# Patient Record
Sex: Male | Born: 1970 | Race: White | Hispanic: No | Marital: Single | Smoking: Former smoker
Health system: Southern US, Community
[De-identification: ages and names within clinical notes are randomized; demographics above are authoritative.]

## PROBLEM LIST (undated history)

## (undated) DIAGNOSIS — I10 Essential (primary) hypertension: Secondary | ICD-10-CM

## (undated) DIAGNOSIS — E119 Type 2 diabetes mellitus without complications: Secondary | ICD-10-CM

---

## 2006-11-22 ENCOUNTER — Ambulatory Visit: Payer: Self-pay | Admitting: Internal Medicine

## 2009-01-06 ENCOUNTER — Emergency Department: Payer: Self-pay | Admitting: Emergency Medicine

## 2010-11-25 ENCOUNTER — Emergency Department: Payer: Self-pay | Admitting: Emergency Medicine

## 2015-08-03 ENCOUNTER — Emergency Department
Admission: EM | Admit: 2015-08-03 | Discharge: 2015-08-03 | Disposition: A | Discharge status: Home / Self Care | Payer: Self-pay | Attending: Emergency Medicine | Admitting: Emergency Medicine

## 2015-08-03 DIAGNOSIS — L989 Disorder of the skin and subcutaneous tissue, unspecified: Secondary | ICD-10-CM | POA: Insufficient documentation

## 2015-08-03 DIAGNOSIS — R21 Rash and other nonspecific skin eruption: Secondary | ICD-10-CM | POA: Insufficient documentation

## 2015-08-03 DIAGNOSIS — Z880 Allergy status to penicillin: Secondary | ICD-10-CM | POA: Insufficient documentation

## 2015-08-03 DIAGNOSIS — Z79899 Other long term (current) drug therapy: Secondary | ICD-10-CM | POA: Insufficient documentation

## 2015-08-03 DIAGNOSIS — Z7982 Long term (current) use of aspirin: Secondary | ICD-10-CM | POA: Insufficient documentation

## 2015-08-03 MED ORDER — MUPIROCIN 2 % EX OINT: TOPICAL_OINTMENT | CUTANEOUS | Status: AC

## 2015-08-03 NOTE — ED Notes (Signed)
States he developed a small area to top of nose about 3-4 days ago.  Area has become larger

## 2015-08-03 NOTE — ED Provider Notes (Signed)
[facility] Emergency Department Provider Note  ____________________________________________  Time seen: Approximately 7:26 AM  I have reviewed the triage vital signs and the nursing notes.   HISTORY  Chief Complaint Skin Ulcer   HPI George Hines is a 44 y.o. male is here with complaint of small area on the top of his nose is been there for approximately one week. He states the area has become larger. In the past he is used peroxide and Neosporin with healing however this time there is no improvement. He denies any fever or chills. We discussed his multiple sunburns and sun exposure going up and even now. Patient states that the area is not painful. He did squeeze on it last night without any drainage. Currently his pain level is 1/10.   No past medical history on file.  There are no active problems to display for this patient.   No past surgical history on file.  Current Outpatient Rx  Name  Route  Sig  Dispense  Refill  . allopurinol (ZYLOPRIM) 100 MG tablet   Oral   Take 100 mg by mouth daily.         Marland Kitchen amLODipine (NORVASC) 10 MG tablet   Oral   Take 10 mg by mouth daily.         Marland Kitchen aspirin 81 MG tablet   Oral   Take 81 mg by mouth daily.         Marland Kitchen atorvastatin (LIPITOR) 40 MG tablet   Oral   Take 40 mg by mouth daily.         . cloNIDine (CATAPRES) 0.1 MG tablet   Oral   Take 0.1 mg by mouth 2 (two) times daily.         Marland Kitchen lisinopril (PRINIVIL,ZESTRIL) 40 MG tablet   Oral   Take 40 mg by mouth daily.         . metFORMIN (GLUCOPHAGE) 500 MG tablet   Oral   Take 500 mg by mouth 2 (two) times daily with a meal.         . metoprolol (LOPRESSOR) 100 MG tablet   Oral   Take 100 mg by mouth 2 (two) times daily.         . mupirocin ointment (BACTROBAN) 2 %      Apply to affected area 3 times daily   22 g   0     Allergies Penicillins  No family history on file.  Social History Social History  Substance Use  Topics  . Smoking status: Not on file  . Smokeless tobacco: Not on file  . Alcohol Use: Not on file    Review of Systems Constitutional: No fever/chills Cardiovascular: Denies chest pain. Respiratory: Denies shortness of breath. Gastrointestinal: No nausea, no vomiting. Skin: Positive for lesion on the nose. No history of MRSA Neurological: Negative for headaches, focal weakness or numbness.  10-point ROS otherwise negative.  ____________________________________________   PHYSICAL EXAM:  VITAL SIGNS: ED Triage Vitals  Enc Vitals Group     BP 08/03/15 0717 169/89 mmHg     Pulse Rate 08/03/15 0717 112     Resp 08/03/15 0717 22     Temp 08/03/15 0717 97.5 F (36.4 C)     Temp Source 08/03/15 0717 Oral     SpO2 08/03/15 0717 100 %     Weight 08/03/15 0717 225 lb (102.059 kg)     Height 08/03/15 0717  (1.676 m)     Head  Cir --      Peak Flow --      Pain Score 08/03/15 0720 1     Pain Loc --      Pain Edu? --      Excl. in GC? --     Constitutional: Alert and oriented. Well appearing and in no acute distress. Eyes: Conjunctivae are normal. PERRL. EOMI. Head: Atraumatic. Nose: No congestion/rhinnorhea. Neck: No stridor.   Hematological/Lymphatic/Immunilogical: No cervical lymphadenopathy. Cardiovascular: Normal rate, regular rhythm. Grossly normal heart sounds.  Good peripheral circulation. Respiratory: Normal respiratory effort.  No retractions. Lungs CTAB. Gastrointestinal: Soft and nontender. No distention.  Musculoskeletal: No lower extremity tenderness nor edema.  No joint effusions. Neurologic:  Normal speech and language. No gross focal neurologic deficits are appreciated. No gait instability. Skin:  Skin is warm, dry and intact. There is a 1 cm shallow ulcerative lesion at the bridge of the patient's nose. Minimal erythema and no drainage noted. There is nontender to touch. There are raised borders that appears moved around this area. Psychiatric: Mood and  affect are normal. Speech and behavior are normal.  ____________________________________________   LABS (all labs ordered are listed, but only abnormal results are displayed)  Labs Reviewed - No data to display  PROCEDURES  Procedure(s) performed: None  Critical Care performed: No  ____________________________________________   INITIAL IMPRESSION / ASSESSMENT AND PLAN / ED COURSE  Pertinent labs & imaging results that were available during my care of the patient were reviewed by me and considered in my medical decision making (see chart for details).  Patient is to follow-up with [facility]. We discussed possibility of skin cancer. His primary care doctor is in [place] and he is unable to get an appointment with [facility] if he is to call his primary care doctor. He was given a prescription for Bactroban and told to discontinue using peroxide. ____________________________________________   FINAL CLINICAL IMPRESSION(S) / ED DIAGNOSES  Final diagnoses:  Skin eruption  Non-healing skin lesion of nose      Tommi Rumps, PA-C 08/03/15 0759  Tommi Rumps, PA-C 08/03/15 0954  Tommi Rumps, PA-C 08/03/15 1015  Darien Ramus, MD 08/03/15 1524

## 2015-08-03 NOTE — ED Notes (Signed)
Infection to the top of nose for about one week. States he has been treating it with peroxide and neosporin with no success.

## 2017-01-29 ENCOUNTER — Emergency Department
Admission: EM | Admit: 2017-01-29 | Discharge: 2017-01-29 | Disposition: A | Discharge status: Home / Self Care | Payer: No Typology Code available for payment source | Attending: Emergency Medicine | Admitting: Emergency Medicine

## 2017-01-29 ENCOUNTER — Emergency Department: Payer: No Typology Code available for payment source

## 2017-01-29 ENCOUNTER — Encounter: Payer: Self-pay | Admitting: Emergency Medicine

## 2017-01-29 DIAGNOSIS — S50811A Abrasion of right forearm, initial encounter: Secondary | ICD-10-CM | POA: Diagnosis not present

## 2017-01-29 DIAGNOSIS — Z7984 Long term (current) use of oral hypoglycemic drugs: Secondary | ICD-10-CM | POA: Diagnosis not present

## 2017-01-29 DIAGNOSIS — Y92410 Unspecified street and highway as the place of occurrence of the external cause: Secondary | ICD-10-CM | POA: Insufficient documentation

## 2017-01-29 DIAGNOSIS — Z7982 Long term (current) use of aspirin: Secondary | ICD-10-CM | POA: Insufficient documentation

## 2017-01-29 DIAGNOSIS — Y9389 Activity, other specified: Secondary | ICD-10-CM | POA: Insufficient documentation

## 2017-01-29 DIAGNOSIS — I10 Essential (primary) hypertension: Secondary | ICD-10-CM | POA: Insufficient documentation

## 2017-01-29 DIAGNOSIS — S50812A Abrasion of left forearm, initial encounter: Secondary | ICD-10-CM | POA: Diagnosis not present

## 2017-01-29 DIAGNOSIS — Y999 Unspecified external cause status: Secondary | ICD-10-CM | POA: Insufficient documentation

## 2017-01-29 DIAGNOSIS — S30811A Abrasion of abdominal wall, initial encounter: Secondary | ICD-10-CM | POA: Insufficient documentation

## 2017-01-29 DIAGNOSIS — T07XXXA Unspecified multiple injuries, initial encounter: Secondary | ICD-10-CM

## 2017-01-29 DIAGNOSIS — Z87891 Personal history of nicotine dependence: Secondary | ICD-10-CM | POA: Insufficient documentation

## 2017-01-29 DIAGNOSIS — S1091XA Abrasion of unspecified part of neck, initial encounter: Secondary | ICD-10-CM | POA: Insufficient documentation

## 2017-01-29 DIAGNOSIS — S199XXA Unspecified injury of neck, initial encounter: Secondary | ICD-10-CM | POA: Diagnosis present

## 2017-01-29 DIAGNOSIS — M542 Cervicalgia: Secondary | ICD-10-CM

## 2017-01-29 DIAGNOSIS — E119 Type 2 diabetes mellitus without complications: Secondary | ICD-10-CM | POA: Diagnosis not present

## 2017-01-29 HISTORY — DX: Type 2 diabetes mellitus without complications: E11.9

## 2017-01-29 HISTORY — DX: Essential (primary) hypertension: I10

## 2017-01-29 MED ORDER — ORPHENADRINE CITRATE 30 MG/ML IJ SOLN
60.0000 mg | INTRAMUSCULAR | Status: AC
  Administered 2017-01-29: 60 mg via INTRAMUSCULAR

## 2017-01-29 MED ORDER — ORPHENADRINE CITRATE 30 MG/ML IJ SOLN
INTRAMUSCULAR | Status: AC
  Filled 2017-01-29: qty 2

## 2017-01-29 MED ORDER — CYCLOBENZAPRINE HCL 5 MG PO TABS: 5.0000 mg | ORAL_TABLET | Freq: Three times a day (TID) | ORAL | 0 refills | Status: AC | PRN

## 2017-01-29 MED ORDER — KETOROLAC TROMETHAMINE 60 MG/2ML IM SOLN
INTRAMUSCULAR | Status: AC
  Filled 2017-01-29: qty 2

## 2017-01-29 MED ORDER — KETOROLAC TROMETHAMINE 60 MG/2ML IM SOLN
60.0000 mg | Freq: Once | INTRAMUSCULAR | Status: AC
  Administered 2017-01-29: 60 mg via INTRAMUSCULAR
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
## 2017-01-29 NOTE — ED Notes (Signed)
Patient transported to CT 
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
## 2018-10-30 ENCOUNTER — Encounter: Payer: Self-pay | Admitting: Emergency Medicine

## 2018-10-30 ENCOUNTER — Other Ambulatory Visit: Payer: Self-pay

## 2018-10-30 ENCOUNTER — Emergency Department
Admission: EM | Admit: 2018-10-30 | Discharge: 2018-10-30 | Disposition: A | Discharge status: Home / Self Care | Payer: Self-pay | Attending: Emergency Medicine | Admitting: Emergency Medicine

## 2018-10-30 ENCOUNTER — Emergency Department: Payer: Self-pay

## 2018-10-30 DIAGNOSIS — E119 Type 2 diabetes mellitus without complications: Secondary | ICD-10-CM | POA: Insufficient documentation

## 2018-10-30 DIAGNOSIS — I10 Essential (primary) hypertension: Secondary | ICD-10-CM | POA: Insufficient documentation

## 2018-10-30 DIAGNOSIS — Z87891 Personal history of nicotine dependence: Secondary | ICD-10-CM | POA: Insufficient documentation

## 2018-10-30 DIAGNOSIS — Z7984 Long term (current) use of oral hypoglycemic drugs: Secondary | ICD-10-CM | POA: Insufficient documentation

## 2018-10-30 DIAGNOSIS — Z7982 Long term (current) use of aspirin: Secondary | ICD-10-CM | POA: Insufficient documentation

## 2018-10-30 DIAGNOSIS — E860 Dehydration: Secondary | ICD-10-CM | POA: Insufficient documentation

## 2018-10-30 DIAGNOSIS — Z79899 Other long term (current) drug therapy: Secondary | ICD-10-CM | POA: Insufficient documentation

## 2018-10-30 DIAGNOSIS — E871 Hypo-osmolality and hyponatremia: Secondary | ICD-10-CM | POA: Insufficient documentation

## 2018-10-30 LAB — CBC
HCT: 35.4 % — ABNORMAL LOW (ref 39.0–52.0)
Hemoglobin: 11.9 g/dL — ABNORMAL LOW (ref 13.0–17.0)
MCH: 28.4 pg (ref 26.0–34.0)
MCHC: 33.6 g/dL (ref 30.0–36.0)
MCV: 84.5 fL (ref 80.0–100.0)
Platelets: 273 10*3/uL (ref 150–400)
RBC: 4.19 MIL/uL — ABNORMAL LOW (ref 4.22–5.81)
RDW: 14.2 % (ref 11.5–15.5)
WBC: 11.2 10*3/uL — ABNORMAL HIGH (ref 4.0–10.5)
nRBC: 0 % (ref 0.0–0.2)

## 2018-10-30 LAB — HEPATIC FUNCTION PANEL
ALT: 12 U/L (ref 0–44)
AST: 18 U/L (ref 15–41)
Albumin: 4.3 g/dL (ref 3.5–5.0)
Alkaline Phosphatase: 52 U/L (ref 38–126)
Bilirubin, Direct: 0.1 mg/dL (ref 0.0–0.2)
Indirect Bilirubin: 0.6 mg/dL (ref 0.3–0.9)
Total Bilirubin: 0.7 mg/dL (ref 0.3–1.2)
Total Protein: 7.5 g/dL (ref 6.5–8.1)

## 2018-10-30 LAB — TROPONIN I: Troponin I: <0.03 ng/mL (ref <0.03)

## 2018-10-30 LAB — BASIC METABOLIC PANEL
Anion gap: 12 (ref 5–15)
BUN: 13 mg/dL (ref 6–20)
CO2: 24 mmol/L (ref 22–32)
Calcium: 9 mg/dL (ref 8.9–10.3)
Chloride: 92 mmol/L — ABNORMAL LOW (ref 98–111)
Creatinine, Ser: 0.7 mg/dL (ref 0.61–1.24)
GFR calc Af Amer: >60 mL/min (ref >60)
GFR calc non Af Amer: >60 mL/min (ref >60)
Glucose, Bld: 146 mg/dL — ABNORMAL HIGH (ref 70–99)
Potassium: 4.1 mmol/L (ref 3.5–5.1)
Sodium: 128 mmol/L — ABNORMAL LOW (ref 135–145)

## 2018-10-30 LAB — LIPASE, BLOOD: Lipase: 24 U/L (ref 11–51)

## 2018-10-30 MED ORDER — SODIUM CHLORIDE 0.9 % IV BOLUS
1000.0000 mL | Freq: Once | INTRAVENOUS | Status: AC
  Administered 2018-10-30: 1000 mL via INTRAVENOUS

## 2018-10-30 MED ORDER — ONDANSETRON HCL 4 MG/2ML IJ SOLN
4.0000 mg | Freq: Once | INTRAMUSCULAR | Status: AC
  Administered 2018-10-30: 4 mg via INTRAVENOUS
  Filled 2018-10-30: qty 2

## 2018-10-30 NOTE — ED Provider Notes (Signed)
[facility] Emergency Department Provider Note  ____________________________________________   I have reviewed the triage vital signs and the nursing notes.   HISTORY  Chief Complaint Nausea and vomiting  History limited by: Not Limited   HPI George Hines is a 47 y.o. male who presents to the emergency department today with multiple medical complaints. The first complaint the patient voiced to me was concern for nausea and vomiting. He states that this started today. He has had multiple episodes of biliary vomiting. This has been accompanied by nausea. He states he also has had a bowel movement which was dark green as well. He denies any associated abdominal pain with these symptoms. In addition he felt like his heart was racing last night. The patient did think he might have been from dehydration. He also states that he thinks he might have passed out last night. He feels like these symptoms might be related to his opioid addiction. States that he has recently switched from morphine to oxycodone. He is buying the opioids off the street. He denies any fevers.    Per medical record review patient has a history of DM, HTN  Past Medical History:  Diagnosis Date  . Diabetes mellitus without complication (HCC)   . Hypertension     There are no active problems to display for this patient.   History reviewed. No pertinent surgical history.  Prior to Admission medications   Medication Sig Start Date End Date Taking? Authorizing Provider  allopurinol (ZYLOPRIM) 100 MG tablet Take 100 mg by mouth daily.    [provider]  amLODipine (NORVASC) 10 MG tablet Take 10 mg by mouth daily.    [provider]  aspirin 81 MG tablet Take 81 mg by mouth daily.    [provider]  atorvastatin (LIPITOR) 40 MG tablet Take 40 mg by mouth daily.    [provider]  cloNIDine (CATAPRES) 0.1 MG tablet Take 0.1 mg by mouth 2 (two) times daily.     [provider]  cyclobenzaprine (FLEXERIL) 5 MG tablet Take 1 tablet (5 mg total) by mouth 3 (three) times daily as needed for muscle spasms. 01/29/17   Menshew, Charlesetta Ivory, PA-C  lisinopril (PRINIVIL,ZESTRIL) 40 MG tablet Take 40 mg by mouth daily.    [provider]  metFORMIN (GLUCOPHAGE) 500 MG tablet Take 500 mg by mouth 2 (two) times daily with a meal.    [provider]  metoprolol (LOPRESSOR) 100 MG tablet Take 100 mg by mouth 2 (two) times daily.    [provider]  mupirocin ointment (BACTROBAN) 2 % Apply to affected area 3 times daily 08/03/15   Tommi Rumps, PA-C    Allergies Penicillins  No family history on file.  Social History Social History   Tobacco Use  . Smoking status: Former Games developer  . Smokeless tobacco: Never Used  Substance Use Topics  . Alcohol use: Not on file  . Drug use: Not on file    Review of Systems Constitutional: No fever/chills Eyes: No visual changes. ENT: No sore throat. Cardiovascular: Denies chest pain. Respiratory: Denies shortness of breath. Gastrointestinal: No abdominal pain.  Positive for nausea, vomiting and diarrhea.  Genitourinary: Negative for dysuria. Musculoskeletal: Negative for back pain. Skin: Negative for rash. Neurological: Negative for headaches, focal weakness or numbness.  ____________________________________________   PHYSICAL EXAM:  VITAL SIGNS: ED Triage Vitals  Enc Vitals Group     BP 10/30/18 1034 (!) 156/74  Pulse Rate 10/30/18 1034 78     Resp 10/30/18 1034 18     Temp 10/30/18 1034 98.2 F (36.8 C)     Temp Source 10/30/18 1034 Oral     SpO2 10/30/18 1034 98 %     Weight 10/30/18 1027 195 lb (88.5 kg)     Height 10/30/18 1027 5\' 6"  (1.676 m)     Head Circumference --      Peak Flow --      Pain Score 10/30/18 1027 0   Constitutional: Alert and oriented.  Eyes: Conjunctivae are normal.  ENT      Head: Normocephalic and atraumatic.      Nose: No  congestion/rhinnorhea.      Mouth/Throat: Mucous membranes are moist.      Neck: No stridor. Hematological/Lymphatic/Immunilogical: No cervical lymphadenopathy. Cardiovascular: Normal rate, regular rhythm.  No murmurs, rubs, or gallops.  Respiratory: Normal respiratory effort without tachypnea nor retractions. Breath sounds are clear and equal bilaterally. No wheezes/rales/rhonchi. Gastrointestinal: Soft and non tender. No rebound. No guarding.  Genitourinary: Deferred Musculoskeletal: Normal range of motion in all extremities. No lower extremity edema. Neurologic:  Normal speech and language. No gross focal neurologic deficits are appreciated.  Skin:  Skin is warm, dry and intact. No rash noted. Psychiatric: Mood and affect are normal. Speech and behavior are normal. Patient exhibits appropriate insight and judgment.  ____________________________________________    LABS (pertinent positives/negatives)  Trop <0.03 CBC wbc 11.2, hgb 11.9, plt 273 BMP na 128, k 4.1, gl 92, glu 146, cr 0.70  ____________________________________________   EKG  I, Phineas SemenGraydon Tyne Banta, attending physician, personally viewed and interpreted this EKG  EKG Time: 1024 Rate: 91 Rhythm: normal sinus rhythm Axis: normal Intervals: qtc 467 QRS: narrow, q waves III ST changes: no st elevation Impression: abnormal ekg  ____________________________________________    RADIOLOGY  CXR No concerning findings   ____________________________________________   PROCEDURES  Procedures  ____________________________________________   INITIAL IMPRESSION / ASSESSMENT AND PLAN / ED COURSE  Pertinent labs & imaging results that were available during my care of the patient were reviewed by me and considered in my medical decision making (see chart for details).   Patient presented to the emergency department today because of multiple medical complaints. Unclear how many may be related to opioid use. Blood work  was obtained. No concerning anemia or leukocytosis. Patients sodium and chloride level were low which suggests dehydration. This could explain some of the patients symptoms including tachycardia. Patient was given IVFs and stated that he did feel better. Discussed importance of follow up with his primary care physician for recheck of sodium level.   ____________________________________________   FINAL CLINICAL IMPRESSION(S) / ED DIAGNOSES  Final diagnoses:  Dehydration  Hyponatremia     Note: This dictation was prepared with Dragon dictation. Any transcriptional errors that result from this process are unintentional     Phineas SemenGoodman, Trevaris Pennella, MD 10/30/18 1444

## 2018-10-30 NOTE — ED Notes (Signed)
Pt assisted to bathroom at this time. He was able to ambulate to and from toilet without difficulty.

## 2018-10-30 NOTE — ED Notes (Signed)
Pt reported that he was feeling better and wanted to go home. MD aware and was getting Discharge papers ready, I took out his IV and when I went back in to give him his papers pt was out of the room and gown was on bed.

## 2018-10-30 NOTE — ED Triage Notes (Addendum)
States he has an opioid addiction. .  Arrives today with c/o heart racing today.  Patient states he does have intermittent episodes of palpitations, but it has been more than 20 years since it has caused him to seek care in an ED.  Patient states he has been taking morphine 15 mg but they were recently switched to oxycodone 30 mg.  Also c/o vomiting this am.  Patient is AAOx3.  Skin warm and dry. NAD

## 2018-10-30 NOTE — Discharge Instructions (Addendum)
Please contact your primary care physician to have your sodium level rechecked. It was low today which can be a sign of dehydration. Please seek medical attention for any high fevers, chest pain, shortness of breath, change in behavior, persistent vomiting, bloody stool or any other new or concerning symptoms.

## 2019-11-28 IMAGING — CR DG CHEST 2V
1 series · 2 of 2 positions shown · non-contrast
Comparison: 11/25/2010

CLINICAL DATA: Palpitations.

EXAM:
CHEST - 2 VIEW

[Series 1: dg chest 2 view · 0.14mm/px · 2 of 2 slices shown]
[im 1/2]
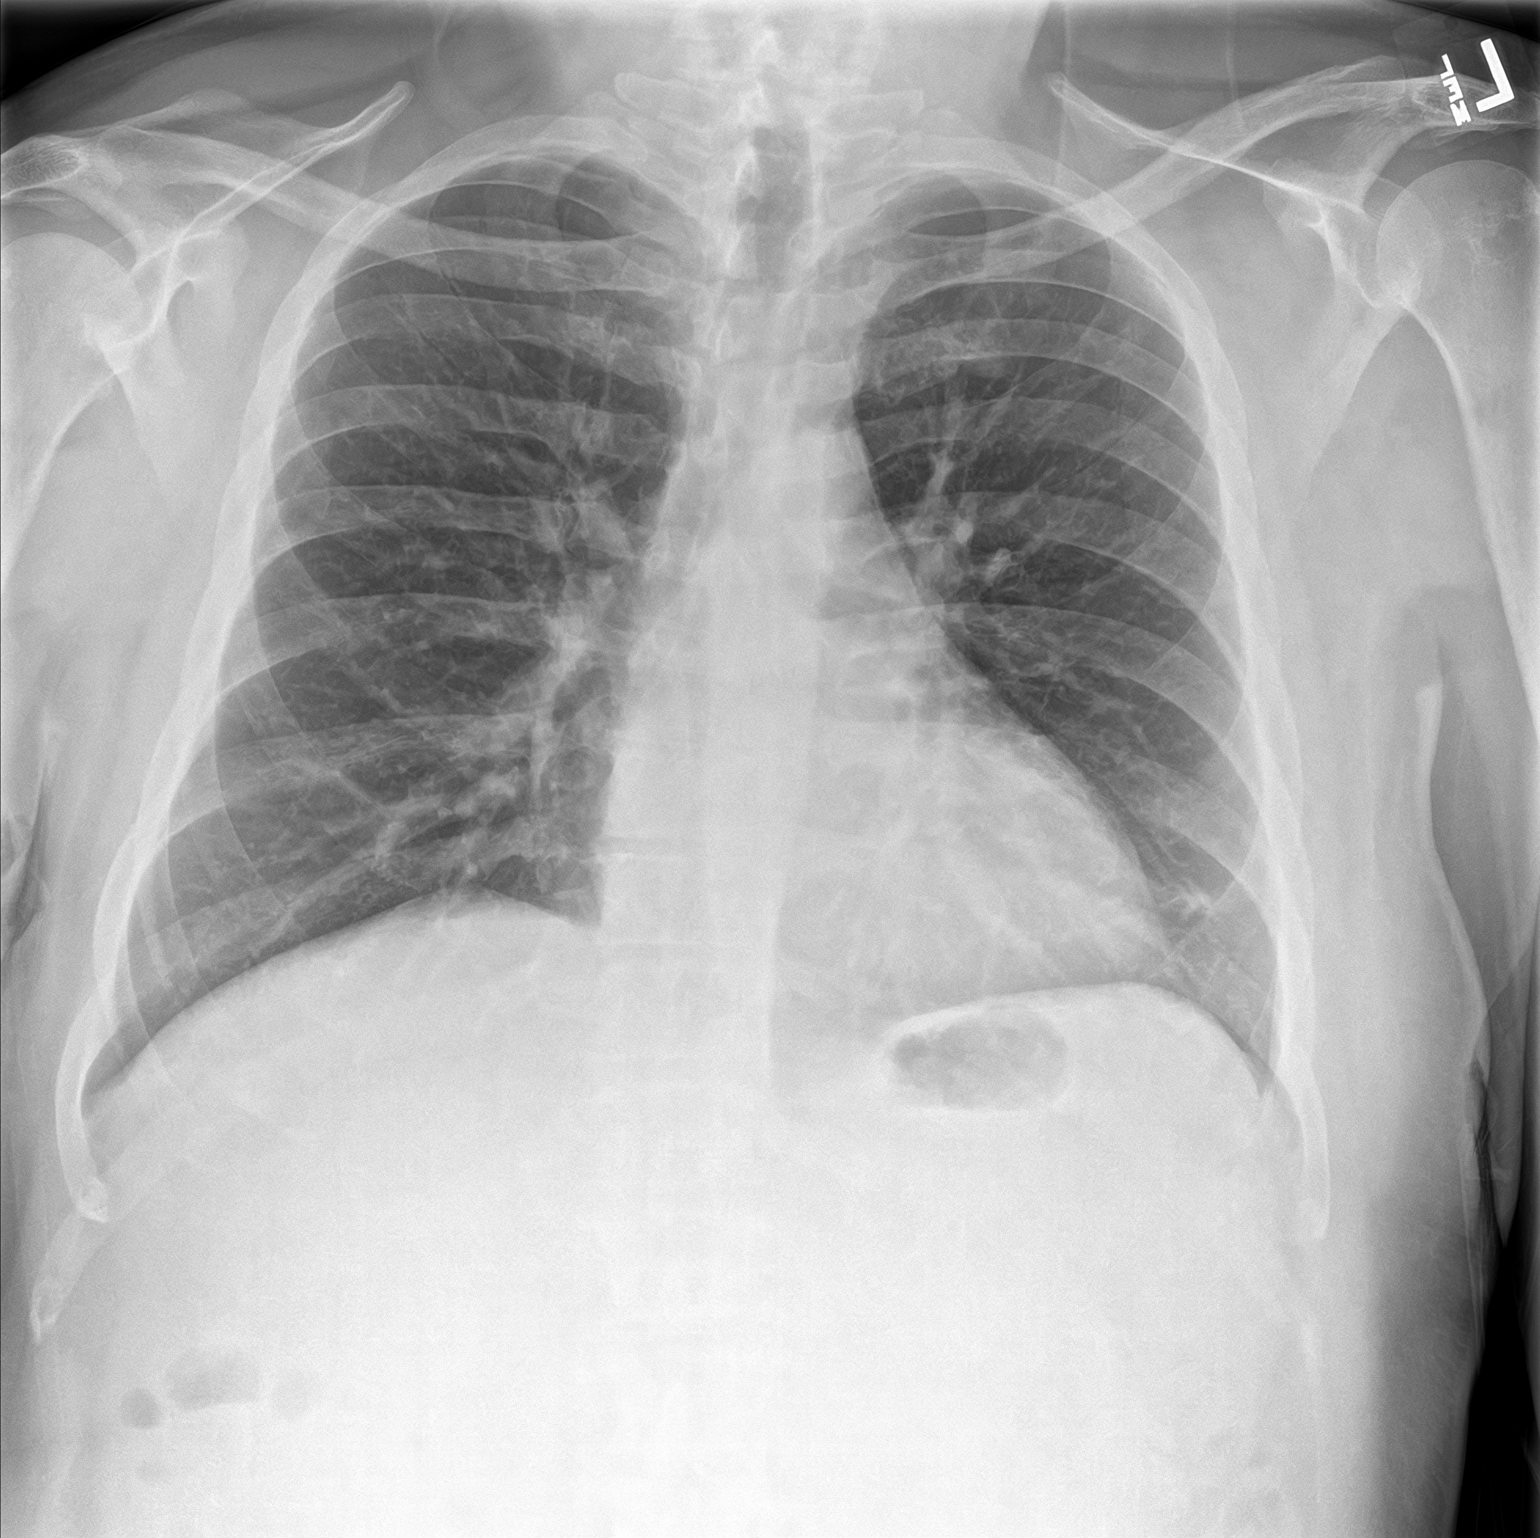
[im 2/2]
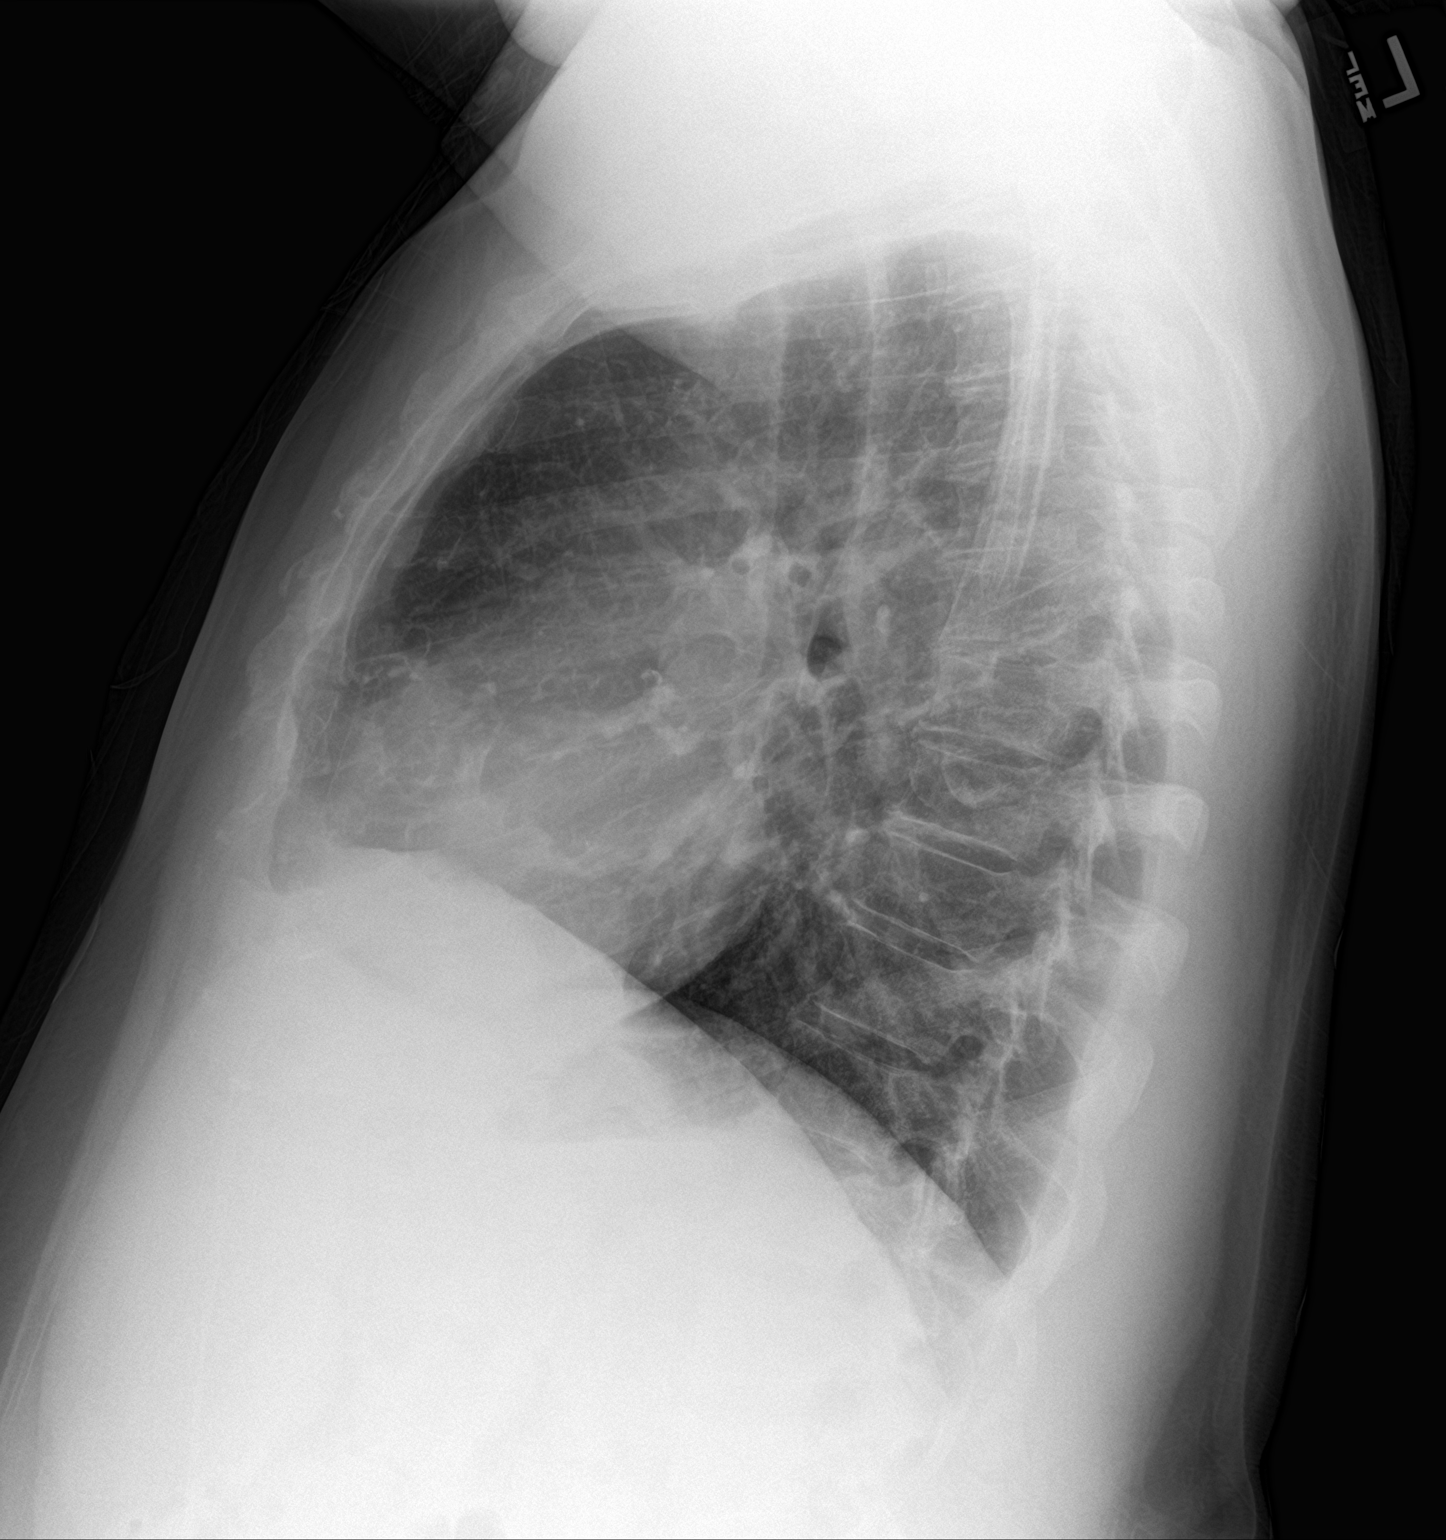

[2 of 2 positions shown; findings below may reference images not displayed]

FINDINGS: The heart size and pulmonary vascularity are normal. There is a
small focal area of probable scarring in the lingula. No effusions.
Bones are normal.
IMPRESSION: No significant abnormalities.

## 2021-01-06 ENCOUNTER — Ambulatory Visit
Admission: RE | Admit: 2021-01-06 | Discharge: 2021-01-06 | Disposition: A | Discharge status: Home / Self Care | Payer: Disability Insurance | Source: Ambulatory Visit | Attending: Thoracic Surgery | Admitting: Thoracic Surgery

## 2021-01-06 ENCOUNTER — Other Ambulatory Visit: Payer: Self-pay | Admitting: Thoracic Surgery

## 2021-01-06 DIAGNOSIS — M1A0610 Idiopathic chronic gout, right knee, without tophus (tophi): Secondary | ICD-10-CM | POA: Insufficient documentation

## 2021-01-06 DIAGNOSIS — M1A0620 Idiopathic chronic gout, left knee, without tophus (tophi): Secondary | ICD-10-CM | POA: Diagnosis not present

## 2021-02-16 ENCOUNTER — Ambulatory Visit
Admission: RE | Admit: 2021-02-16 | Discharge: 2021-02-16 | Disposition: A | Discharge status: Home / Self Care | Payer: Disability Insurance | Attending: Thoracic Surgery | Admitting: Thoracic Surgery

## 2021-03-13 ENCOUNTER — Other Ambulatory Visit: Payer: Self-pay | Admitting: Thoracic Surgery

## 2021-03-13 DIAGNOSIS — M545 Low back pain, unspecified: Secondary | ICD-10-CM

## 2024-11-15 DEATH — deceased
